# Patient Record
Sex: Female | Born: 1970 | Race: White | Hispanic: No | Marital: Married | Smoking: Never smoker
Health system: Southern US, Community
[De-identification: ages and names within clinical notes are randomized; demographics above are authoritative.]

## PROBLEM LIST (undated history)

## (undated) HISTORY — PX: NO PAST SURGERIES: SHX2092

---

## 2011-07-26 ENCOUNTER — Ambulatory Visit: Payer: Self-pay | Admitting: Internal Medicine

## 2019-10-03 ENCOUNTER — Ambulatory Visit
Admission: EM | Admit: 2019-10-03 | Discharge: 2019-10-03 | Disposition: A | Discharge status: Home / Self Care | Payer: 59 | Attending: Family Medicine | Admitting: Family Medicine

## 2019-10-03 ENCOUNTER — Ambulatory Visit (INDEPENDENT_AMBULATORY_CARE_PROVIDER_SITE_OTHER): Payer: 59

## 2019-10-03 ENCOUNTER — Other Ambulatory Visit: Payer: Self-pay

## 2019-10-03 DIAGNOSIS — S93491A Sprain of other ligament of right ankle, initial encounter: Secondary | ICD-10-CM | POA: Diagnosis not present

## 2019-10-03 DIAGNOSIS — S93401A Sprain of unspecified ligament of right ankle, initial encounter: Secondary | ICD-10-CM

## 2019-10-03 NOTE — ED Provider Notes (Signed)
[place], [place]   Name: Jeanne Young DOB: Jun 07, 1970 MRN: [id number] CSN: [id number] PCP: Estell Harpin, MD  Arrival date and time:  10/03/19 1147  Chief Complaint:  Ankle Pain (right)  NOTE: Prior to seeing the patient today, I have reviewed the triage nursing documentation and vital signs. Clinical staff has updated patient's PMH/PSHx, current medication list, and drug allergies/intolerances to ensure comprehensive history available to assist in medical decision making.   History:   HPI: Jeanne Young is a 49 y.o. female who presents today with complaints of pain and swelling to her RIGHT foot and ankle. Symptoms started following an inversion injury that occurred earlier this morning. Patient advises that she "stepped wrong" and turned her ankle. 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 The majority of her pain and swelling is to the dorsal aspect of the mid-foot. She has FROM and sensation in the foot and ankle, however reports that normal ROM movements are painful. In efforts to conservatively manage her symptoms at home, the patient notes that she has used IBU 600 mg, which has helped to improve her symptoms.    History reviewed. No pertinent past medical history.  Past Surgical History:  Procedure Laterality Date   NO PAST SURGERIES     NO PAST SURGERIES      Family History  Problem Relation Age of Onset   OCD Mother     Social History   Tobacco Use   Smoking status: Never Smoker   Smokeless tobacco: Never Used  Substance Use Topics   Alcohol use: Never   Drug use: Never    There are no problems to display for this patient.   Home Medications:    No outpatient medications have been marked as taking for the 10/03/19 encounter [facility] Encounter).    Allergies:   Patient has no known allergies.  Review of Systems (ROS):  Review of systems NEGATIVE unless otherwise noted in narrative H&P section.   Vital  Signs: Today's Vitals   10/03/19 1240 10/03/19 1244 10/03/19 1409  BP:  (!) 140/93   Pulse:  87   Resp:  18   Temp:  98.1 F (36.7 C)   TempSrc:  Oral   SpO2:  100%   Weight: 160 lb (72.6 kg)    Height: 5\' 6"  (1.676 m)    PainSc: 5   5     Physical Exam: Physical Exam  Constitutional: She is oriented to person, place, and time and well-developed, well-nourished, and in no distress.  HENT:  Head: Normocephalic and atraumatic.  Eyes: Pupils are equal, round, and reactive to light.  Cardiovascular: Normal rate, regular rhythm, normal heart sounds and intact distal pulses.  Pulmonary/Chest: Effort normal and breath sounds normal.  Musculoskeletal:     Right ankle: Swelling present. No deformity, ecchymosis or lacerations. Tenderness (generalized) present. Normal range of motion. Normal pulse.     Right foot: Normal range of motion and normal capillary refill. Swelling and tenderness present. No deformity, laceration or crepitus.       Feet:     Comments: (+) PMS noted distally; normal color, temperature, and capillary refill.  Neurological: She is alert and oriented to person, place, and time. She has normal strength and normal reflexes. Gait (2/2 acute foot and ankle pain) abnormal.  Skin: Skin is warm and dry. No rash noted. She is not diaphoretic.  Psychiatric: Mood, memory, affect and judgment normal.  Nursing note and  vitals reviewed.   Urgent Care Treatments / Results:   Orders Placed This Encounter  Procedures   DG Ankle Complete Right   DG Foot Complete Right   Apply ASO ankle    LABS: PLEASE NOTE: all labs that were ordered this encounter are listed, however only abnormal results are displayed. Labs Reviewed - No data to display  EKG: -None  RADIOLOGY: DG Ankle Complete Right  Result Date: 10/03/2019 CLINICAL DATA:  Injury.  Pain and swelling. EXAM: RIGHT ANKLE - COMPLETE 3+ VIEW COMPARISON:  No prior. FINDINGS: No acute bony or joint abnormality  identified. No evidence of fracture dislocation. Sclerotic changes noted about the right calcaneus. This may be from healed benign bone lesion or fracture. Other etiologies of sclerotic lesions cannot be completely excluded. If symptoms persist MRI can be obtained. No radiopaque foreign body. IMPRESSION: No acute bony or joint abnormality identified. Sclerotic changes noted about the calcaneus, possibly from healed benign bone lesion or healed fracture. No radiopaque foreign body Electronically Signed   By: Maisie Fus  Register   On: 10/03/2019 13:26   DG Foot Complete Right  Result Date: 10/03/2019 CLINICAL DATA:  Inversion injury earlier today with pain and swelling right foot and ankle. EXAM: RIGHT FOOT COMPLETE - 3+ VIEW COMPARISON:  None. FINDINGS: Mild degenerate change of the first MTP joint. No evidence of acute fracture or dislocation. Bone island over the posterior calcaneus. IMPRESSION: No acute fracture. Electronically Signed   By: Elberta Fortis M.D.   On: 10/03/2019 13:24    PROCEDURES: Procedures  MEDICATIONS RECEIVED THIS VISIT: Medications - No data to display  PERTINENT CLINICAL COURSE NOTES/UPDATES:   Initial Impression / Assessment and Plan / Urgent Care Course:  Pertinent labs & imaging results that were available during my care of the patient were personally reviewed by me and considered in my medical decision making (see lab/imaging section of note for values and interpretations).  Jeanne Young is a 49 y.o. female who presents to [facility] today with complaints of Ankle Pain (right)  Patient is well appearing overall in clinic today. She does not appear to be in any acute distress. Presenting symptoms (see HPI) and exam as documented above. Radiographs negative. Suspect sprain of ATFL. Will place patient is ASO brace for support and comfort. Patient has crutches at home to use. Dicussed rest, ice, and elevation. Patient to continue IBU PRN for pain and swelling. If  not improving, patient to follow up with PCP to discuss referral to orthopedics.   Discussed follow up with primary care physician in 1 week for re-evaluation. I have reviewed the follow up and strict return precautions for any new or worsening symptoms. Patient is aware of symptoms that would be deemed urgent/emergent, and would thus require further evaluation either here or in the emergency department. At the time of discharge, she verbalized understanding and consent with the discharge plan as it was reviewed with her. All questions were fielded by provider and/or clinic staff prior to patient discharge.    Final Clinical Impressions / Urgent Care Diagnoses:   Final diagnoses:  Sprain of anterior talofibular ligament of right ankle, initial encounter  Inversion sprain of right ankle, initial encounter    New Prescriptions:  [place] Controlled Substance Registry consulted? Not Applicable  No orders of the defined types were placed in this encounter.   Recommended Follow up Care:  Patient encouraged to follow up with the following provider within the specified time frame, or sooner as dictated  by the severity of her symptoms. As always, she was instructed that for any urgent/emergent care needs, she should seek care either here or in the emergency department for more immediate evaluation.  Follow-up Information    Maeola Sarah, MD In 1 week.   Specialty: Family Medicine Why: General reassessment of symptoms if not improving Contact information: [address] [place] [address] [phone]         NOTE: This note was prepared using Dragon dictation software along with smaller phrase technology. Despite my best ability to proofread, there is the potential that transcriptional errors may still occur from this process, and are completely unintentional.    Karen Kitchens, NP 10/03/19 1541

## 2019-10-03 NOTE — Discharge Instructions (Signed)
It was very nice seeing you today in clinic. Thank you for entrusting me with your care.   Rest, ice, and elevate your ankle. Continue ibuprofen as needed for pain. Wear brace for comfort and support.   Make arrangements to follow up with your regular doctor in 1 week for re-evaluation if not improving. If your symptoms/condition worsens, please seek follow up care either here or in the ER. Please remember, our [facility] providers are "right here with you" when you need [place].   Again, it was my pleasure to take care of you today. Thank you for choosing our clinic. I hope that you start to feel better quickly.   Quentin Mulling, MSN, APRN, FNP-C, CEN Advanced Practice Provider [facility] [place] [facility]

## 2019-10-03 NOTE — ED Triage Notes (Signed)
Patient states that she stepped wrong this morning and has swelling on the ankle and top of her right foot. States that she rolled her ankle.

## 2020-10-29 ENCOUNTER — Other Ambulatory Visit: Payer: Self-pay

## 2020-10-29 ENCOUNTER — Ambulatory Visit
Admission: EM | Admit: 2020-10-29 | Discharge: 2020-10-29 | Disposition: A | Discharge status: Home / Self Care | Payer: 59 | Attending: Internal Medicine | Admitting: Internal Medicine

## 2020-10-29 DIAGNOSIS — L739 Follicular disorder, unspecified: Secondary | ICD-10-CM | POA: Diagnosis not present

## 2020-10-29 DIAGNOSIS — R21 Rash and other nonspecific skin eruption: Secondary | ICD-10-CM | POA: Diagnosis not present

## 2020-10-29 MED ORDER — DOXYCYCLINE HYCLATE 100 MG PO CAPS
100.0000 mg | ORAL_CAPSULE | Freq: Two times a day (BID) | ORAL | 0 refills | Status: DC
Start: 2020-10-29 — End: 2023-06-14

## 2020-10-29 MED ORDER — TRIAMCINOLONE ACETONIDE 0.1 % EX CREA
1.0000 | TOPICAL_CREAM | Freq: Two times a day (BID) | CUTANEOUS | 0 refills | Status: DC
Start: 2020-10-29 — End: 2023-06-14

## 2020-10-29 NOTE — Discharge Instructions (Signed)
Follow up with your primary care doctor next week if you are not better or get worse

## 2020-10-29 NOTE — ED Triage Notes (Signed)
Patient states that she has been having a rash since Friday. States that area has continued to itch and spread. Currently is only on her left lower leg.

## 2020-10-29 NOTE — ED Provider Notes (Signed)
[facility]    CSN: [id number] Arrival date & time: 10/29/20  1017      History   Chief Complaint Chief Complaint  Patient presents with  . Rash    HPI Jeanne Young is a 50 y.o. female who presents L lower leg rash  X 4 days. States she has been doing yard work and thought she has a bug bite on her anterior distal leg since this area was itchy and had fine pink papules, since then this area has become more itchy and the redness is angrier, but not painful. It has moved to her entire shin and there are a few papules moving to her knee. Does not have a rash on her R lower leg. She has very sensitive skin when she gets bug bites. Has applied antifungal cream which has not helped. Also baking soda which helped the itching.     History reviewed. No pertinent past medical history.  There are no problems to display for this patient.   Past Surgical History:  Procedure Laterality Date  . NO PAST SURGERIES    . NO PAST SURGERIES      OB History   No obstetric history on file.      Home Medications    Prior to Admission medications   Medication Sig Start Date End Date Taking? Authorizing Provider  doxycycline (VIBRAMYCIN) 100 MG capsule Take 1 capsule (100 mg total) by mouth 2 (two) times daily. 10/29/20  Yes Rodriguez-Southworth, Nettie Elm, PA-C  triamcinolone cream (KENALOG) 0.1 % Apply 1 application topically 2 (two) times daily. X 7 days 10/29/20  Yes Rodriguez-Southworth, Nettie Elm, PA-C    Family History Family History  Problem Relation Age of Onset  . OCD Mother     Social History Social History   Tobacco Use  . Smoking status: Never Smoker  . Smokeless tobacco: Never Used  Vaping Use  . Vaping Use: Never used  Substance Use Topics  . Alcohol use: Never  . Drug use: Never     Allergies   Patient has no known allergies.   Review of Systems Review of Systems  Musculoskeletal: Negative for gait problem.  Skin: Positive for color change,  rash and wound. Negative for pallor.     Physical Exam Triage Vital Signs ED Triage Vitals  Enc Vitals Group     BP 10/29/20 1037 (!) 155/89     Pulse Rate 10/29/20 1037 81     Resp 10/29/20 1037 18     Temp 10/29/20 1037 98.7 F (37.1 C)     Temp Source 10/29/20 1037 Oral     SpO2 10/29/20 1037 99 %     Weight 10/29/20 1035 165 lb (74.8 kg)     Height 10/29/20 1035 5\' 6"  (1.676 m)     Head Circumference --      Peak Flow --      Pain Score 10/29/20 1035 0     Pain Loc --      Pain Edu? --      Excl. in GC? --    No data found.  Updated Vital Signs BP (!) 155/89 (BP Location: Left Arm)   Pulse 81   Temp 98.7 F (37.1 C) (Oral)   Resp 18   Ht 5\' 6"  (1.676 m)   Wt 165 lb (74.8 kg)   SpO2 99%   BMI 26.63 kg/m   Visual Acuity Right Eye Distance:   Left Eye Distance:   Bilateral Distance:  Right Eye Near:   Left Eye Near:    Bilateral Near:     Physical Exam Vitals and nursing note reviewed.  Constitutional:      General: She is not in acute distress.    Appearance: She is normal weight. She is not toxic-appearing.  HENT:     Head: Normocephalic.     Right Ear: External ear normal.     Left Ear: External ear normal.  Eyes:     General: No scleral icterus.    Conjunctiva/sclera: Conjunctivae normal.  Pulmonary:     Effort: Pulmonary effort is normal.  Musculoskeletal:     Cervical back: Neck supple.  Skin:    General: Skin is warm and dry.     Findings: Rash present.     Comments: L LOWER LEG- with fine follicle red papules and a couple of areas with open skin and little warmth, but not painful. It does not cover the lateral or posterior lower leg skin or go down her feet.   Neurological:     Mental Status: She is alert and oriented to person, place, and time.     Gait: Gait normal.  Psychiatric:        Mood and Affect: Mood normal.        Behavior: Behavior normal.        Thought Content: Thought content normal.        Judgment: Judgment normal.       UC Treatments / Results  Labs (all labs ordered are listed, but only abnormal results are displayed) Labs Reviewed - No data to display  EKG   Radiology No results found.  Procedures Procedures (including critical care time)  Medications Ordered in UC Medications - No data to display  Initial Impression / Assessment and Plan / UC Course  I have reviewed the triage vital signs and the nursing notes. L lower leg folliculitis. I placed her on Doxy and Triamcinolone as noted. See instructions.  Final Clinical Impressions(s) / UC Diagnoses   Final diagnoses:  Folliculitis  Rash     Discharge Instructions     Follow up with your primary care doctor next week if you are not better or get worse     ED Prescriptions    Medication Sig Dispense Auth. Provider   triamcinolone cream (KENALOG) 0.1 % Apply 1 application topically 2 (two) times daily. X 7 days 30 g Rodriguez-Southworth, Nettie Elm, PA-C   doxycycline (VIBRAMYCIN) 100 MG capsule Take 1 capsule (100 mg total) by mouth 2 (two) times daily. 14 capsule Rodriguez-Southworth, Nettie Elm, PA-C     PDMP not reviewed this encounter.   Garey Ham, PA-C 10/29/20 1101

## 2020-12-08 IMAGING — CR DG ANKLE COMPLETE 3+V*R*
3 series · 3 of 3 positions shown · non-contrast
Comparison: No prior.

CLINICAL DATA: Injury.  Pain and swelling.

EXAM:
RIGHT ANKLE - COMPLETE 3+ VIEW

[ankle ap]
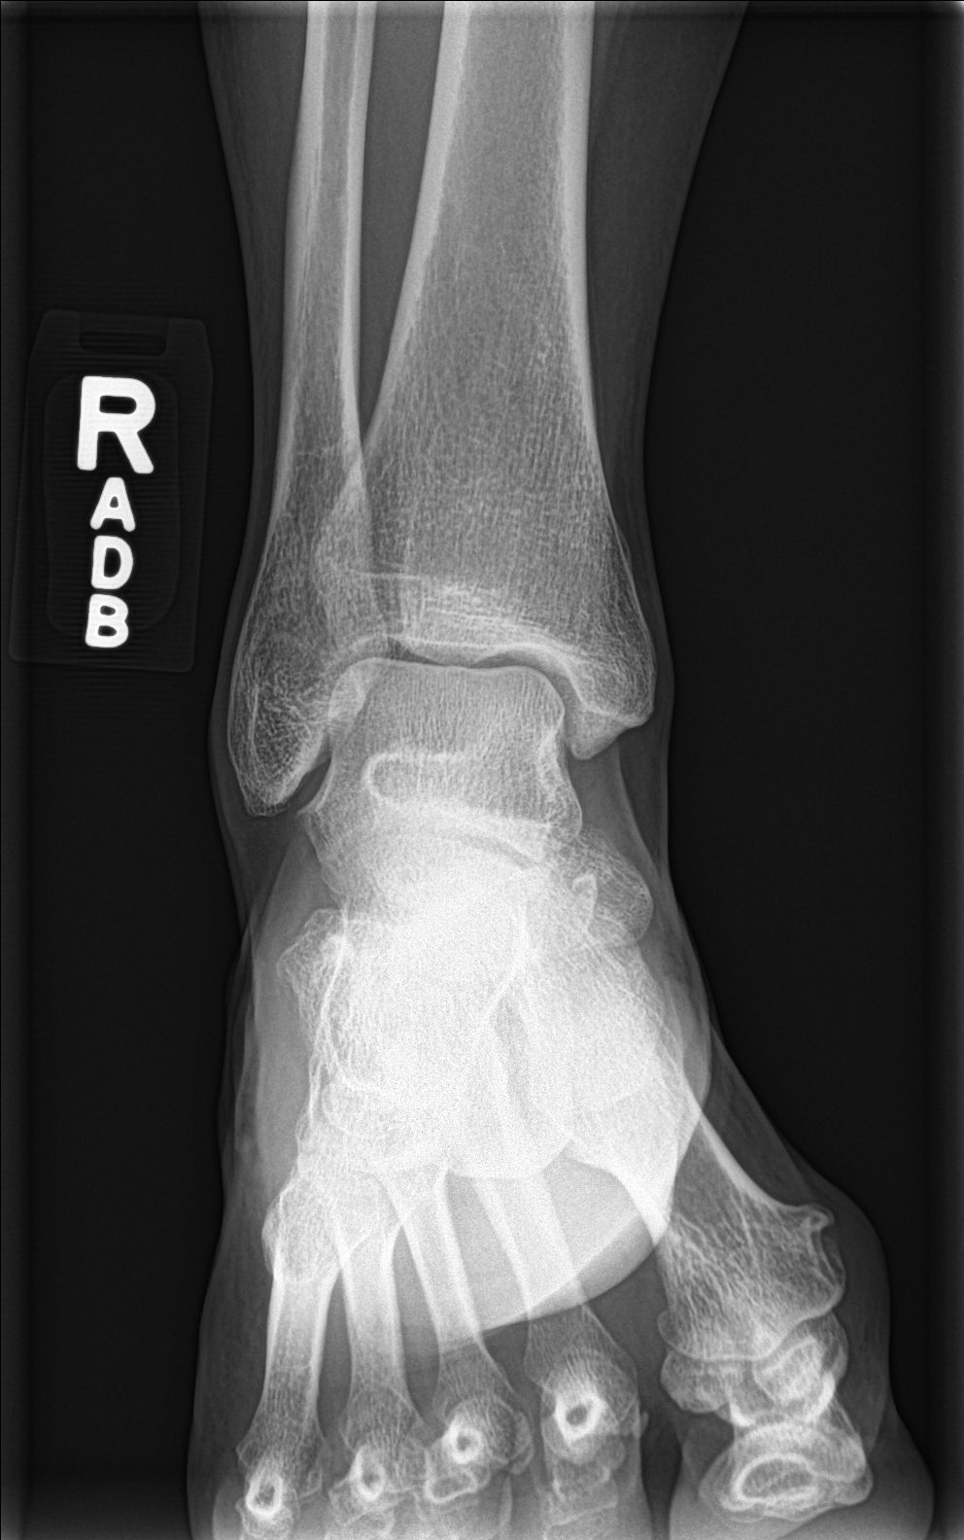

[ankle obl]
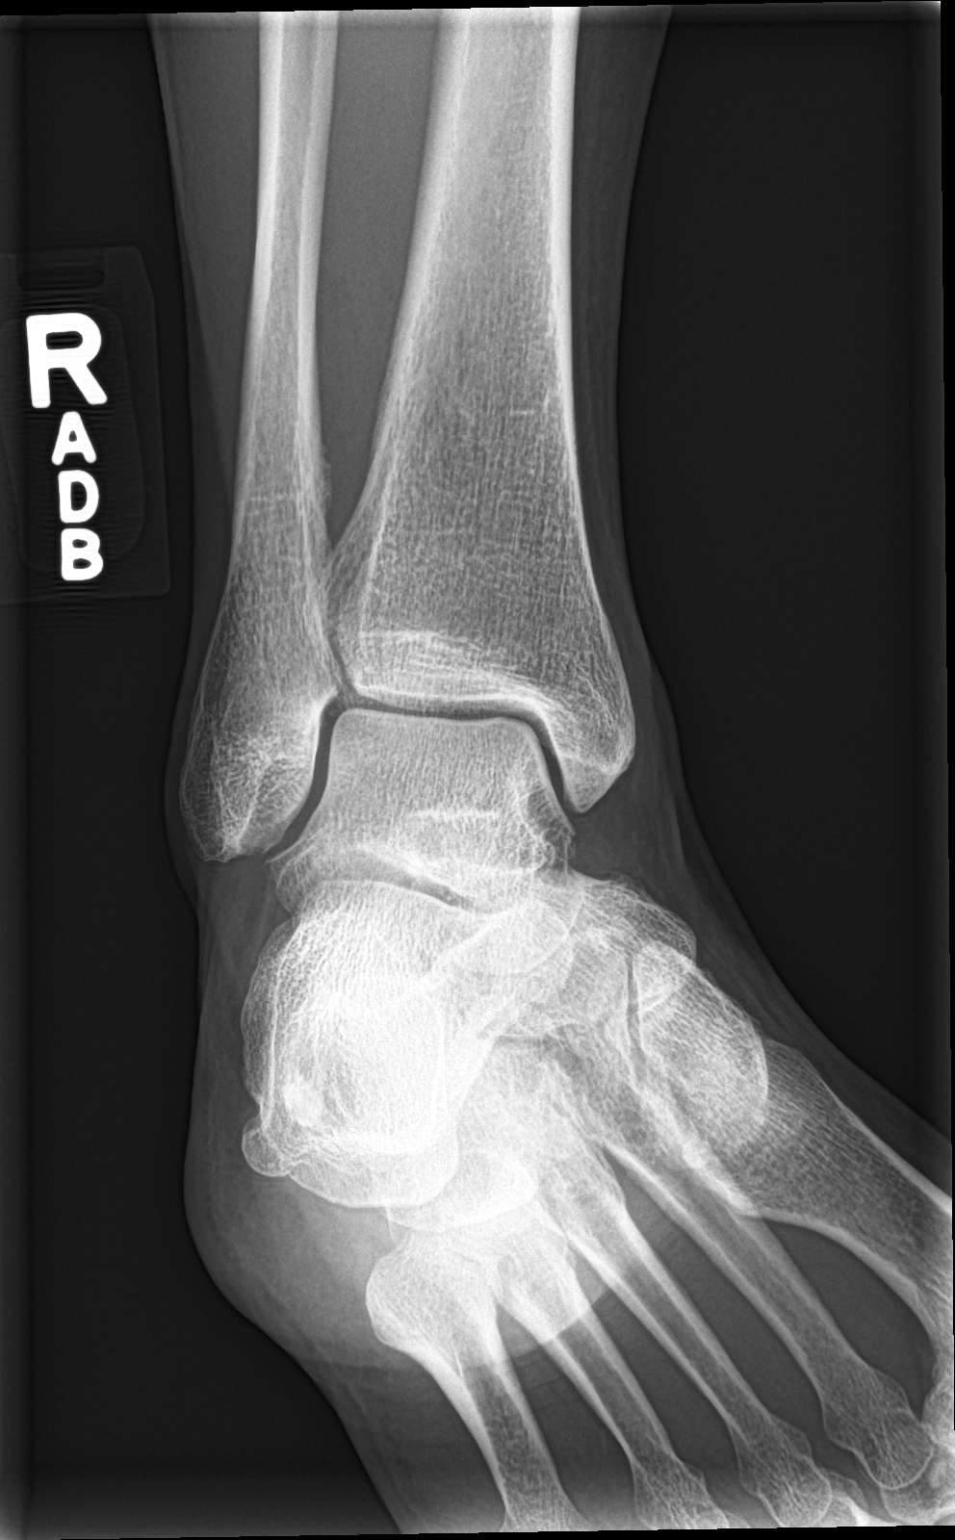

[ankle lat]
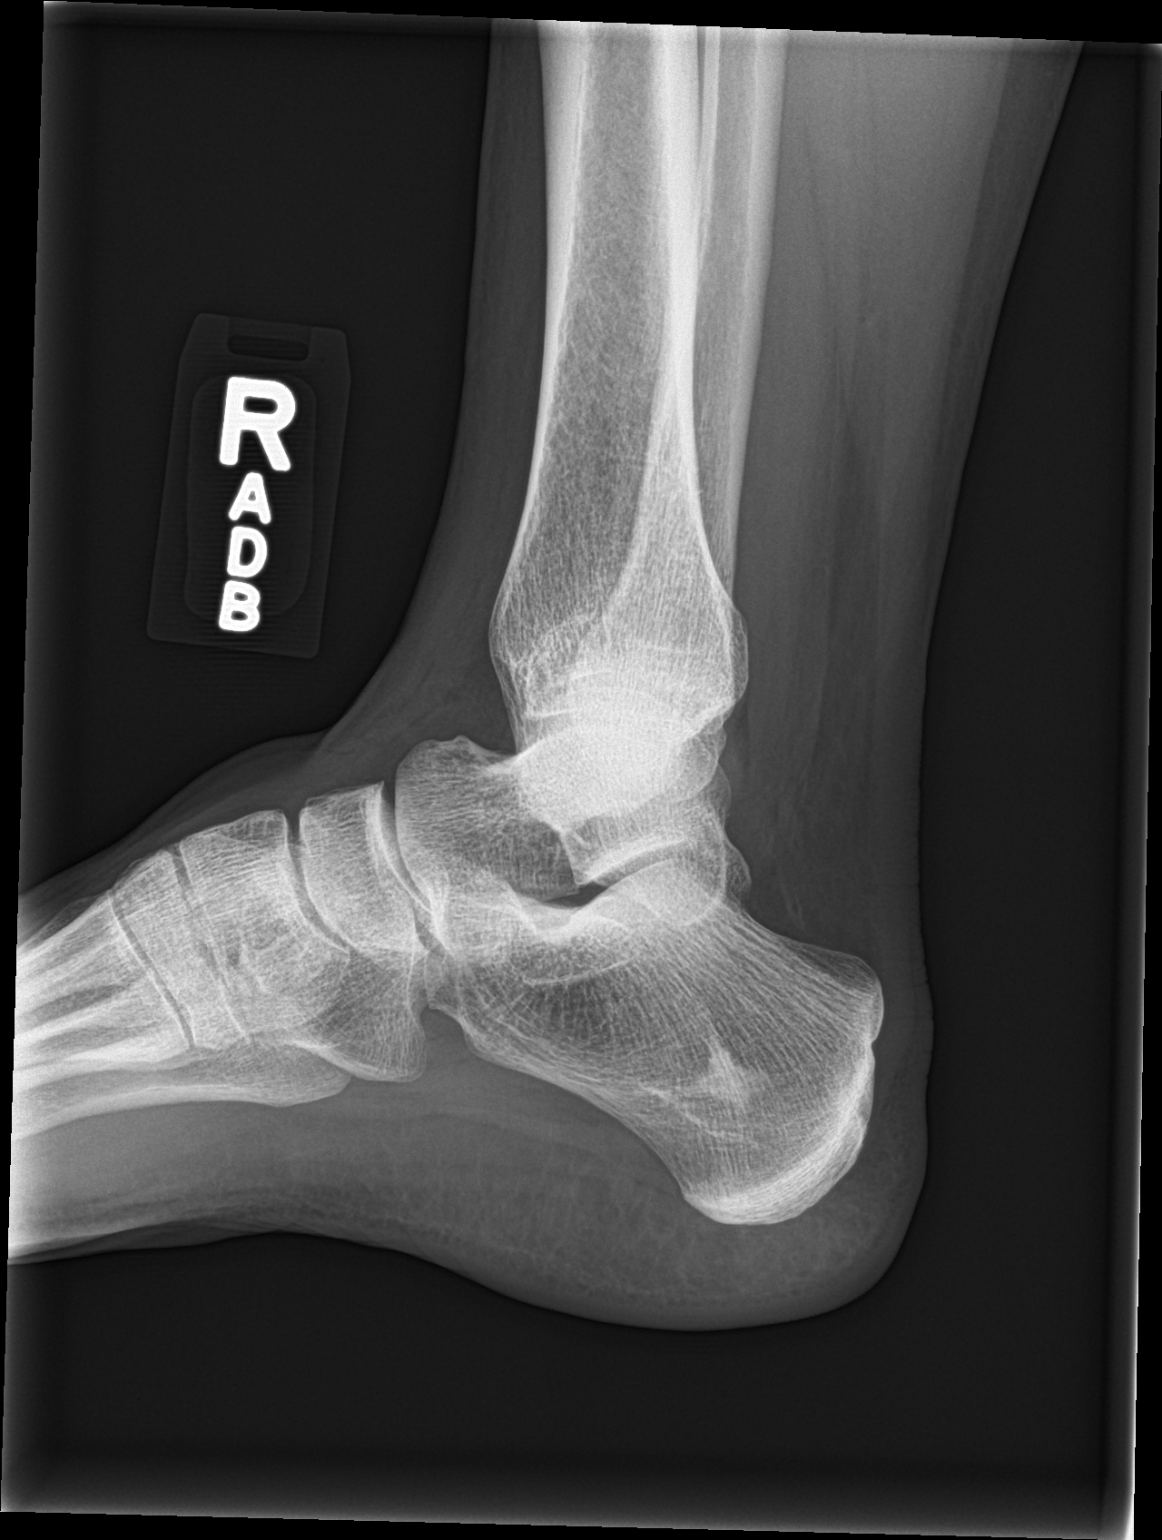

[3 of 3 positions shown; findings below may reference images not displayed]

FINDINGS: No acute bony or joint abnormality identified. No evidence of
fracture dislocation. Sclerotic changes noted about the right
calcaneus. This may be from healed benign bone lesion or fracture.
Other etiologies of sclerotic lesions cannot be completely excluded.
If symptoms persist MRI can be obtained. No radiopaque foreign body.
IMPRESSION: No acute bony or joint abnormality identified. Sclerotic changes
noted about the calcaneus, possibly from healed benign bone lesion
or healed fracture. No radiopaque foreign body

## 2020-12-08 IMAGING — CR DG FOOT COMPLETE 3+V*R*
3 series · 3 of 3 positions shown · non-contrast
Comparison: None.

CLINICAL DATA: Inversion injury earlier today with pain and
swelling right foot and ankle.

EXAM:
RIGHT FOOT COMPLETE - 3+ VIEW

[foot ap]
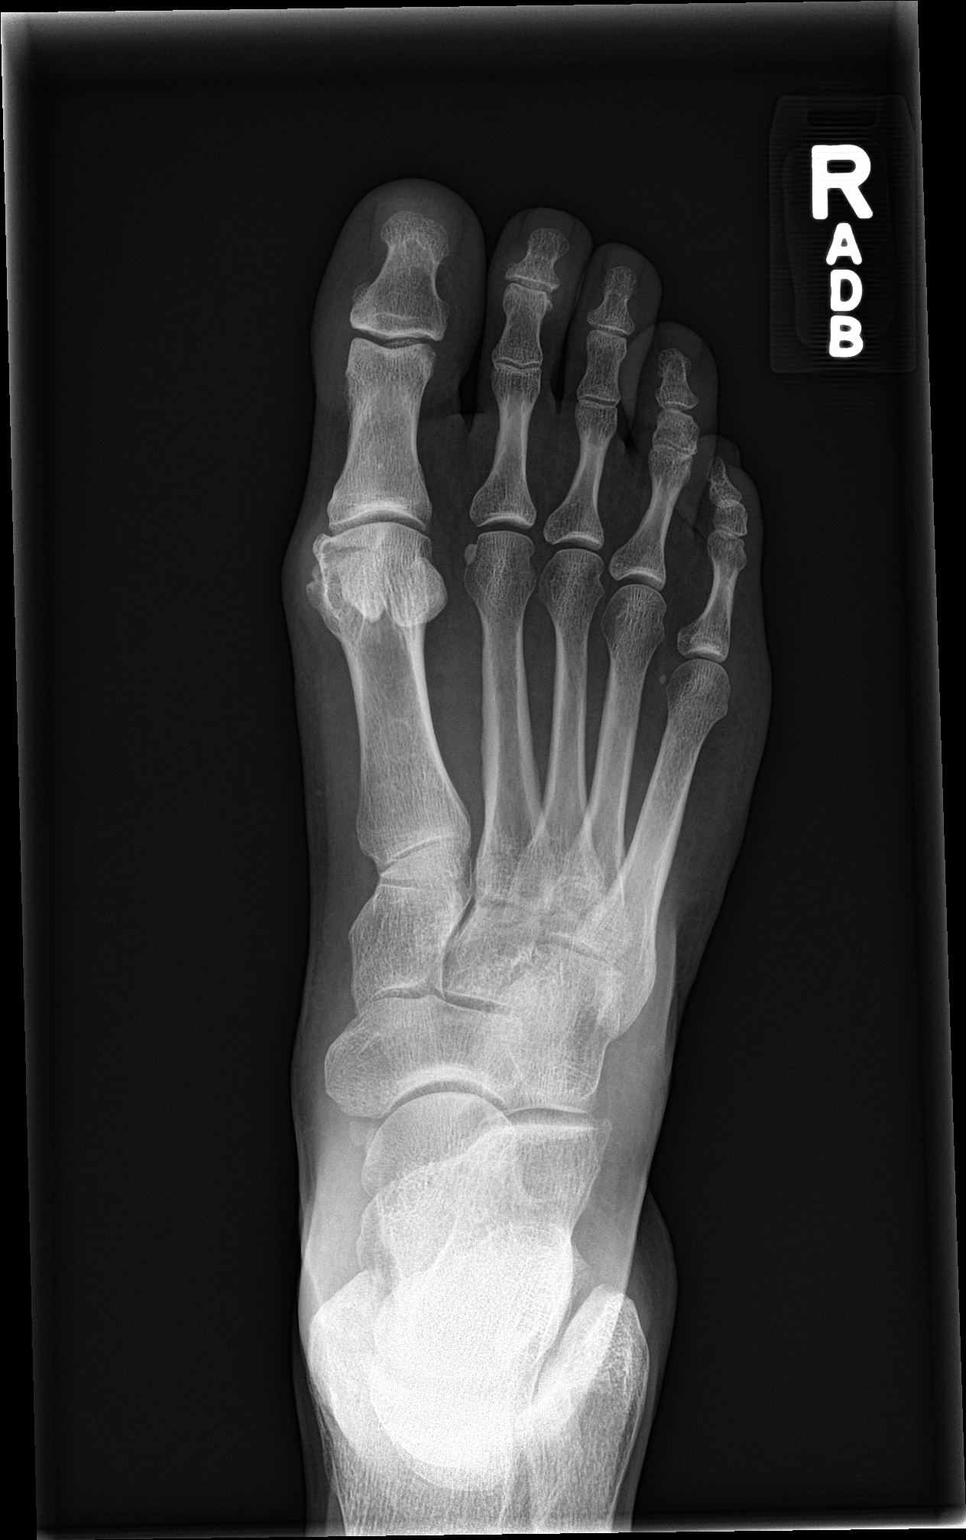

[foot obl]
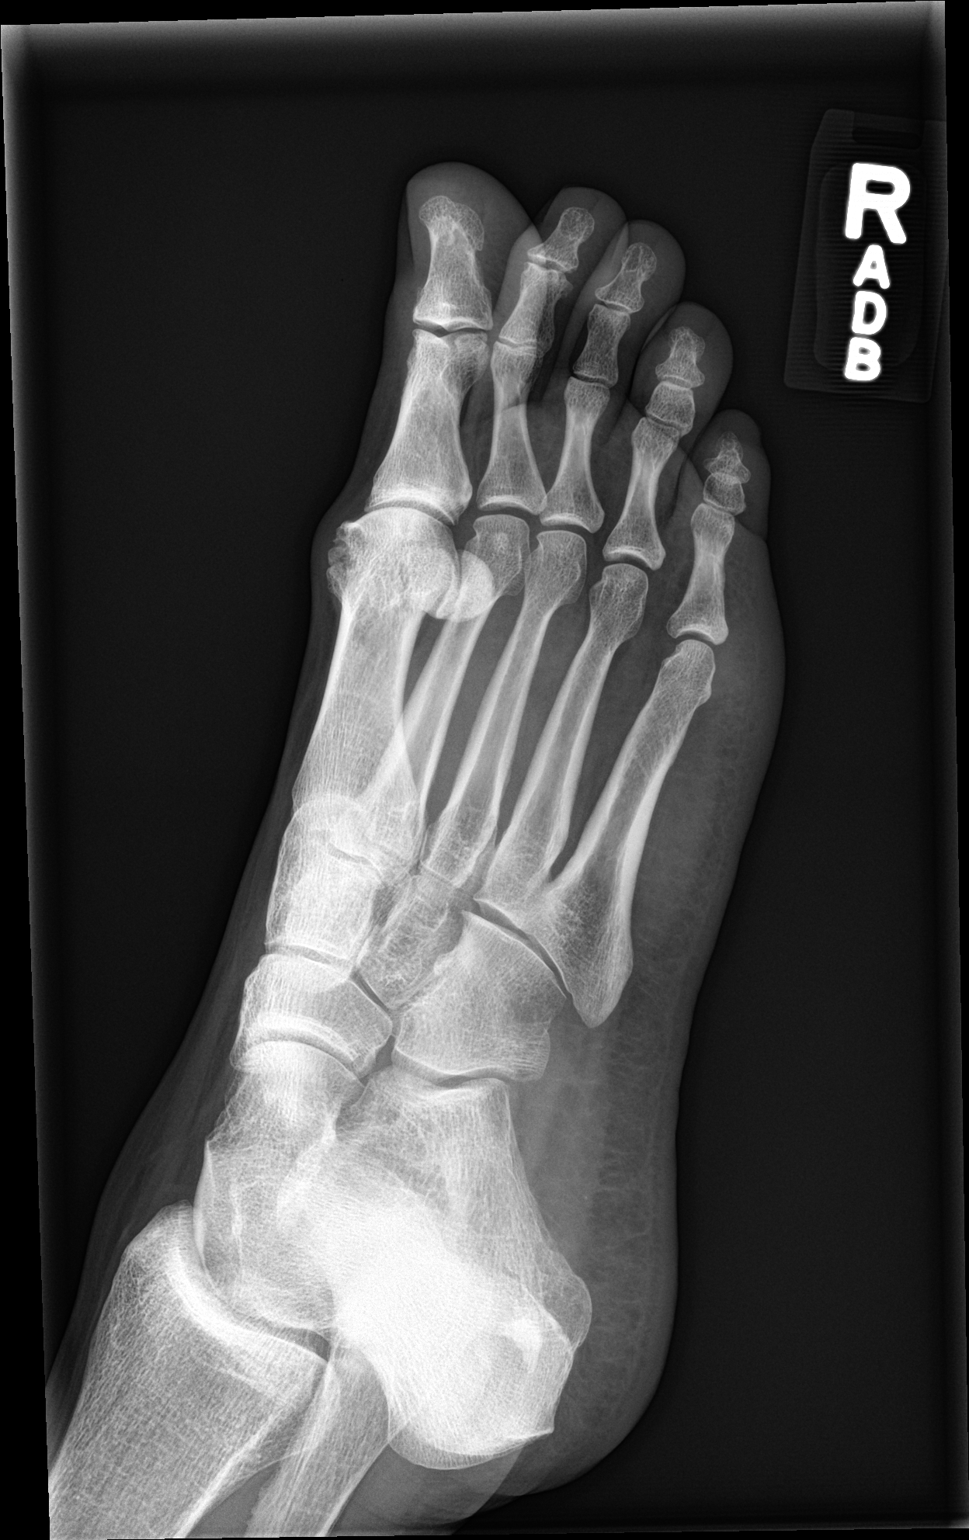

[foot lat]
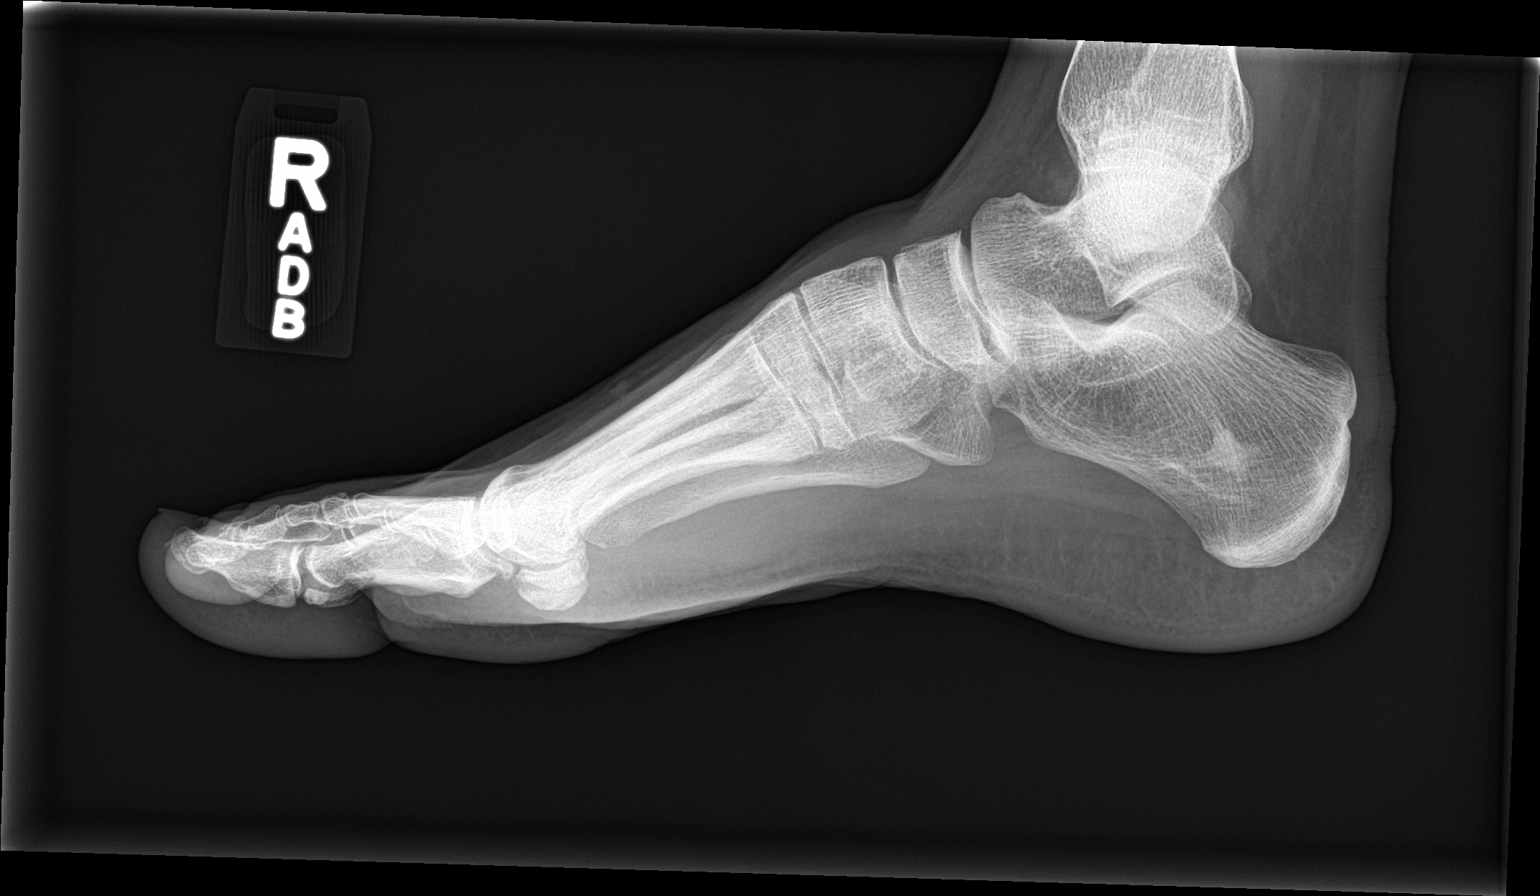

[3 of 3 positions shown; findings below may reference images not displayed]

FINDINGS: Mild degenerate change of the first MTP joint. No evidence of acute
fracture or dislocation. Bone island over the posterior calcaneus.
IMPRESSION: No acute fracture.

## 2023-06-14 ENCOUNTER — Ambulatory Visit
Admission: EM | Admit: 2023-06-14 | Discharge: 2023-06-14 | Disposition: A | Discharge status: Home / Self Care | Payer: 59 | Attending: Physician Assistant | Admitting: Physician Assistant

## 2023-06-14 ENCOUNTER — Encounter: Payer: Self-pay | Admitting: Emergency Medicine

## 2023-06-14 DIAGNOSIS — L259 Unspecified contact dermatitis, unspecified cause: Secondary | ICD-10-CM

## 2023-06-14 DIAGNOSIS — R21 Rash and other nonspecific skin eruption: Secondary | ICD-10-CM | POA: Diagnosis not present

## 2023-06-14 MED ORDER — TRIAMCINOLONE ACETONIDE 0.5 % EX OINT: 1.0000 | TOPICAL_OINTMENT | Freq: Two times a day (BID) | CUTANEOUS | 0 refills | Status: AC

## 2023-06-14 NOTE — Discharge Instructions (Addendum)
-  Begin corticosteroid ointment -Also consider OTC antihistamines  -If rash changes or worsens consider returning for re-eval

## 2023-06-14 NOTE — ED Provider Notes (Signed)
 [facility]    CSN: [id number] Arrival date & time: 06/14/23  9073      History   Chief Complaint Chief Complaint  Patient presents with   Rash    HPI Jeanne Young is a 53 y.o. female presenting for 2-day history of pruritic rash of upper abdomen.  Denies any known trigger.  No changes to medications, detergents, body washes, etc.  Applied a topical Indian herb without relief.  HPI  History reviewed. No pertinent past medical history.  There are no active problems to display for this patient.   Past Surgical History:  Procedure Laterality Date   NO PAST SURGERIES     NO PAST SURGERIES      OB History   No obstetric history on file.      Home Medications    Prior to Admission medications   Medication Sig Start Date End Date Taking? Authorizing Provider  triamcinolone  ointment (KENALOG ) 0.5 % Apply 1 Application topically 2 (two) times daily for 7 days. 06/14/23 06/21/23 Yes Arvis Jolan NOVAK, PA-C    Family History Family History  Problem Relation Age of Onset   OCD Mother     Social History Social History   Tobacco Use   Smoking status: Never   Smokeless tobacco: Never  Vaping Use   Vaping status: Never Used  Substance Use Topics   Alcohol use: Yes    Comment: social   Drug use: Never     Allergies   Patient has no known allergies.   Review of Systems Review of Systems  Constitutional:  Negative for fatigue and fever.  Skin:  Positive for color change and rash.     Physical Exam Triage Vital Signs ED Triage Vitals  Encounter Vitals Group     BP 06/14/23 1016 (!) 146/78     Systolic BP Percentile --      Diastolic BP Percentile --      Pulse Rate 06/14/23 1016 93     Resp 06/14/23 1016 18     Temp 06/14/23 1016 98.4 F (36.9 C)     Temp Source 06/14/23 1016 Oral     SpO2 06/14/23 1016 100 %     Weight --      Height --      Head Circumference --      Peak Flow --      Pain Score 06/14/23 1014 0     Pain Loc  --      Pain Education --      Exclude from Growth Chart --    No data found.  Updated Vital Signs BP (!) 146/78 (BP Location: Left Arm)   Pulse 93   Temp 98.4 F (36.9 C) (Oral)   Resp 18   SpO2 100%      Physical Exam Vitals and nursing note reviewed.  Constitutional:      General: She is not in acute distress.    Appearance: Normal appearance. She is not ill-appearing or toxic-appearing.  HENT:     Head: Normocephalic and atraumatic.  Eyes:     General: No scleral icterus.       Right eye: No discharge.        Left eye: No discharge.     Conjunctiva/sclera: Conjunctivae normal.  Cardiovascular:     Rate and Rhythm: Normal rate and regular rhythm.  Pulmonary:     Effort: Pulmonary effort is normal. No respiratory distress.  Musculoskeletal:     Cervical back:  Neck supple.  Skin:    General: Skin is dry.     Findings: Rash (See image included in chart. There is a erythematous macular rash of right upper abdomen and a few scattered fine papules of upper abdomen) present.  Neurological:     General: No focal deficit present.     Mental Status: She is alert. Mental status is at baseline.     Motor: No weakness.     Gait: Gait normal.  Psychiatric:        Mood and Affect: Mood normal.        Behavior: Behavior normal.      UC Treatments / Results  Labs (all labs ordered are listed, but only abnormal results are displayed) Labs Reviewed - No data to display  EKG   Radiology No results found.  Procedures Procedures (including critical care time)  Medications Ordered in UC Medications - No data to display  Initial Impression / Assessment and Plan / UC Course  I have reviewed the triage vital signs and the nursing notes.  Pertinent labs & imaging results that were available during my care of the patient were reviewed by me and considered in my medical decision making (see chart for details).   53 year old female presents for pruritic rash of upper  abdomen x 2 days.  No known trigger.  She tried a producer, television/film/video herb.  See image included in chart.  Consistent with some sort of contact dermatitis.  Treating at this time topical triamcinolone  ointment.  Also advised OTC antihistamines and cool compresses.  Reviewed return precautions.   Final Clinical Impressions(s) / UC Diagnoses   Final diagnoses:  Rash and nonspecific skin eruption  Contact dermatitis, unspecified contact dermatitis type, unspecified trigger     Discharge Instructions      -Begin corticosteroid ointment -Also consider OTC antihistamines  -If rash changes or worsens consider returning for re-eval     ED Prescriptions     Medication Sig Dispense Auth. Provider   triamcinolone  ointment (KENALOG ) 0.5 % Apply 1 Application topically 2 (two) times daily for 7 days. 30 g Arvis Jolan KATHEE DEVONNA      PDMP not reviewed this encounter.   Arvis Jolan KATHEE, PA-C 06/14/23 1105

## 2023-06-14 NOTE — ED Triage Notes (Signed)
 Patient c/o rash on abdomen x 2 days.
